# Patient Record
Sex: Male | Born: 1960 | Race: White | Hispanic: No | Marital: Married | Smoking: Current every day smoker
Health system: Southern US, Community
[De-identification: ages and names within clinical notes are randomized; demographics above are authoritative.]

## PROBLEM LIST (undated history)

## (undated) DIAGNOSIS — R7611 Nonspecific reaction to tuberculin skin test without active tuberculosis: Secondary | ICD-10-CM

## (undated) DIAGNOSIS — I10 Essential (primary) hypertension: Secondary | ICD-10-CM

## (undated) DIAGNOSIS — N529 Male erectile dysfunction, unspecified: Secondary | ICD-10-CM

## (undated) DIAGNOSIS — Z8781 Personal history of (healed) traumatic fracture: Secondary | ICD-10-CM

## (undated) DIAGNOSIS — R7989 Other specified abnormal findings of blood chemistry: Secondary | ICD-10-CM

## (undated) DIAGNOSIS — Z789 Other specified health status: Secondary | ICD-10-CM

## (undated) DIAGNOSIS — F17200 Nicotine dependence, unspecified, uncomplicated: Secondary | ICD-10-CM

## (undated) HISTORY — DX: Nicotine dependence, unspecified, uncomplicated: F17.200

## (undated) HISTORY — DX: Male erectile dysfunction, unspecified: N52.9

## (undated) HISTORY — PX: NECK SURGERY: SHX720

## (undated) HISTORY — DX: Other specified abnormal findings of blood chemistry: R79.89

## (undated) HISTORY — DX: Personal history of (healed) traumatic fracture: Z87.81

## (undated) HISTORY — DX: Essential (primary) hypertension: I10

## (undated) HISTORY — DX: Other specified health status: Z78.9

## (undated) HISTORY — PX: APPENDECTOMY: SHX54

## (undated) HISTORY — DX: Nonspecific reaction to tuberculin skin test without active tuberculosis: R76.11

---

## 2006-12-21 ENCOUNTER — Ambulatory Visit: Payer: Self-pay | Admitting: General Surgery

## 2006-12-23 ENCOUNTER — Other Ambulatory Visit: Payer: Self-pay

## 2006-12-23 ENCOUNTER — Ambulatory Visit: Payer: Self-pay | Admitting: General Surgery

## 2007-01-23 ENCOUNTER — Emergency Department: Payer: Self-pay | Admitting: Emergency Medicine

## 2009-05-23 ENCOUNTER — Ambulatory Visit: Payer: Self-pay | Admitting: Neurology

## 2009-05-28 ENCOUNTER — Inpatient Hospital Stay (HOSPITAL_COMMUNITY): Admission: RE | Admit: 2009-05-28 | Discharge: 2009-05-29 | Payer: Self-pay | Admitting: Neurosurgery

## 2010-07-22 IMAGING — CR DG CHEST 2V
2 series · 2 of 2 positions shown · non-contrast
Comparison: None

CLINICAL DATA: Preop cervical HNP

CHEST - 2 VIEW

[view not recorded (1 of 2)]
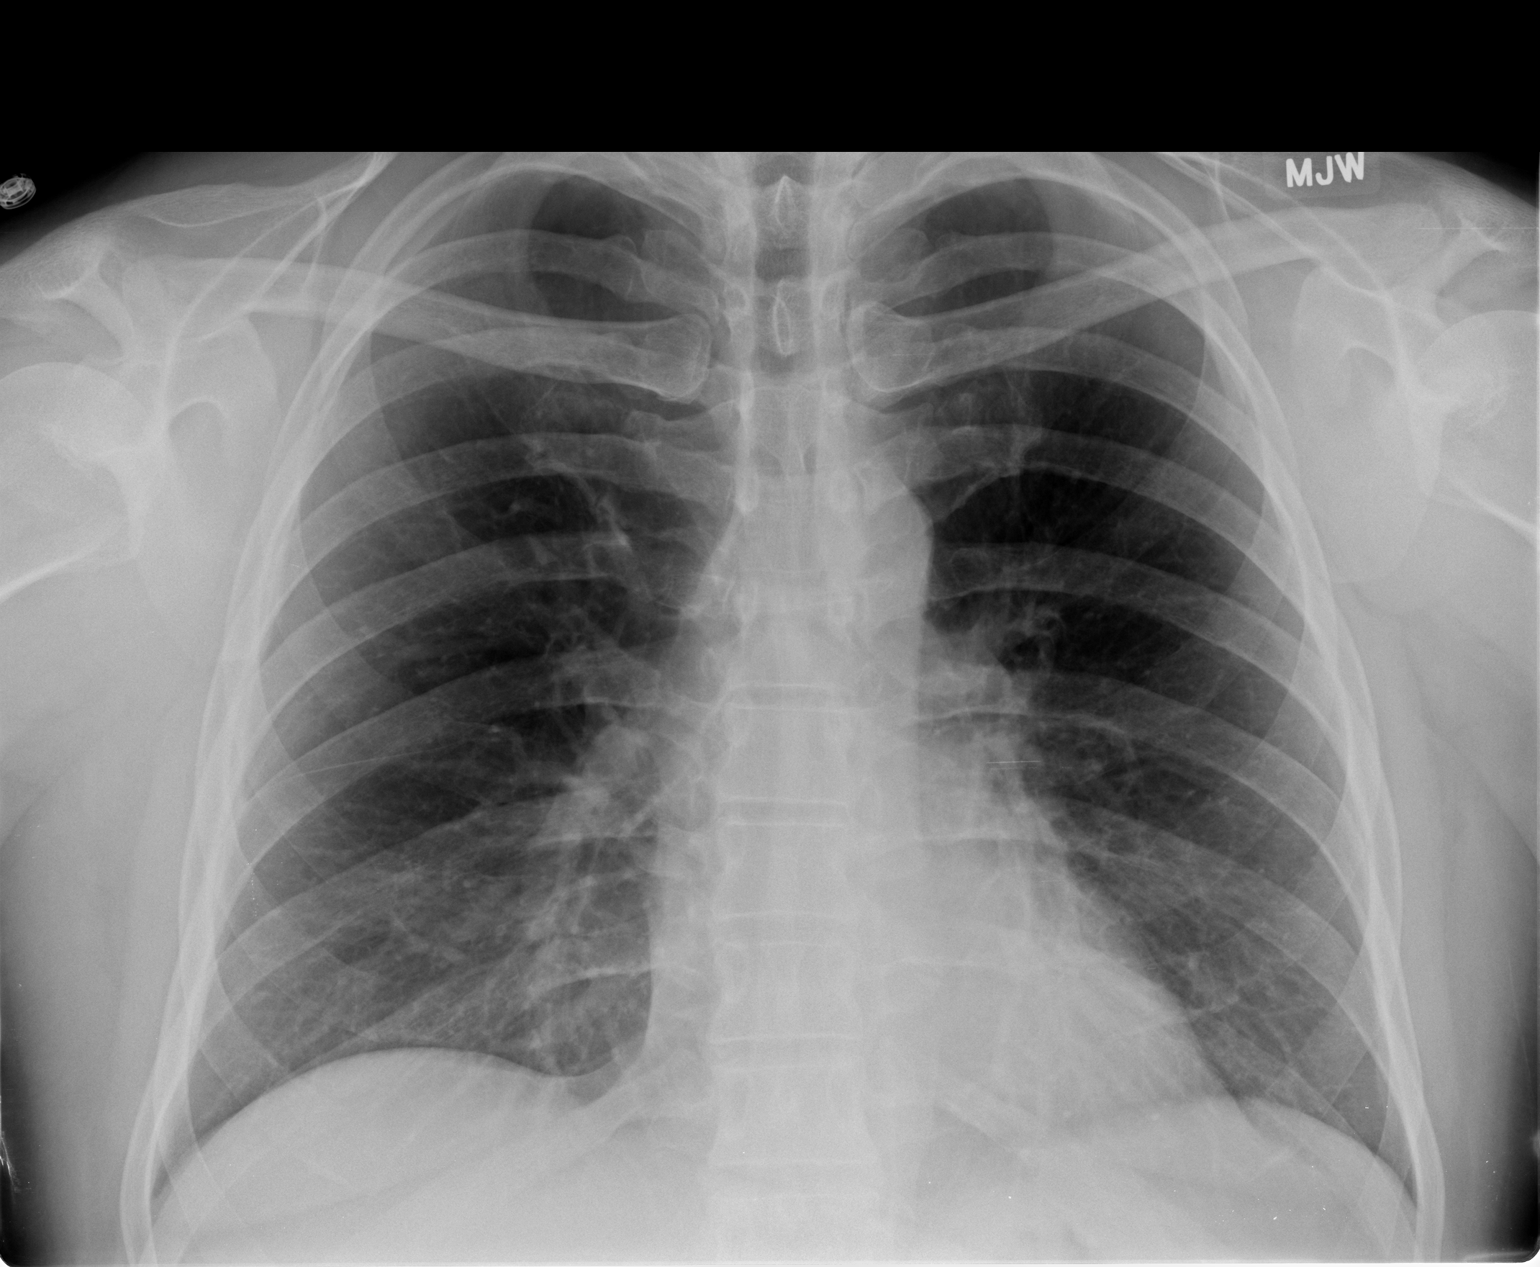

[view not recorded (2 of 2)]
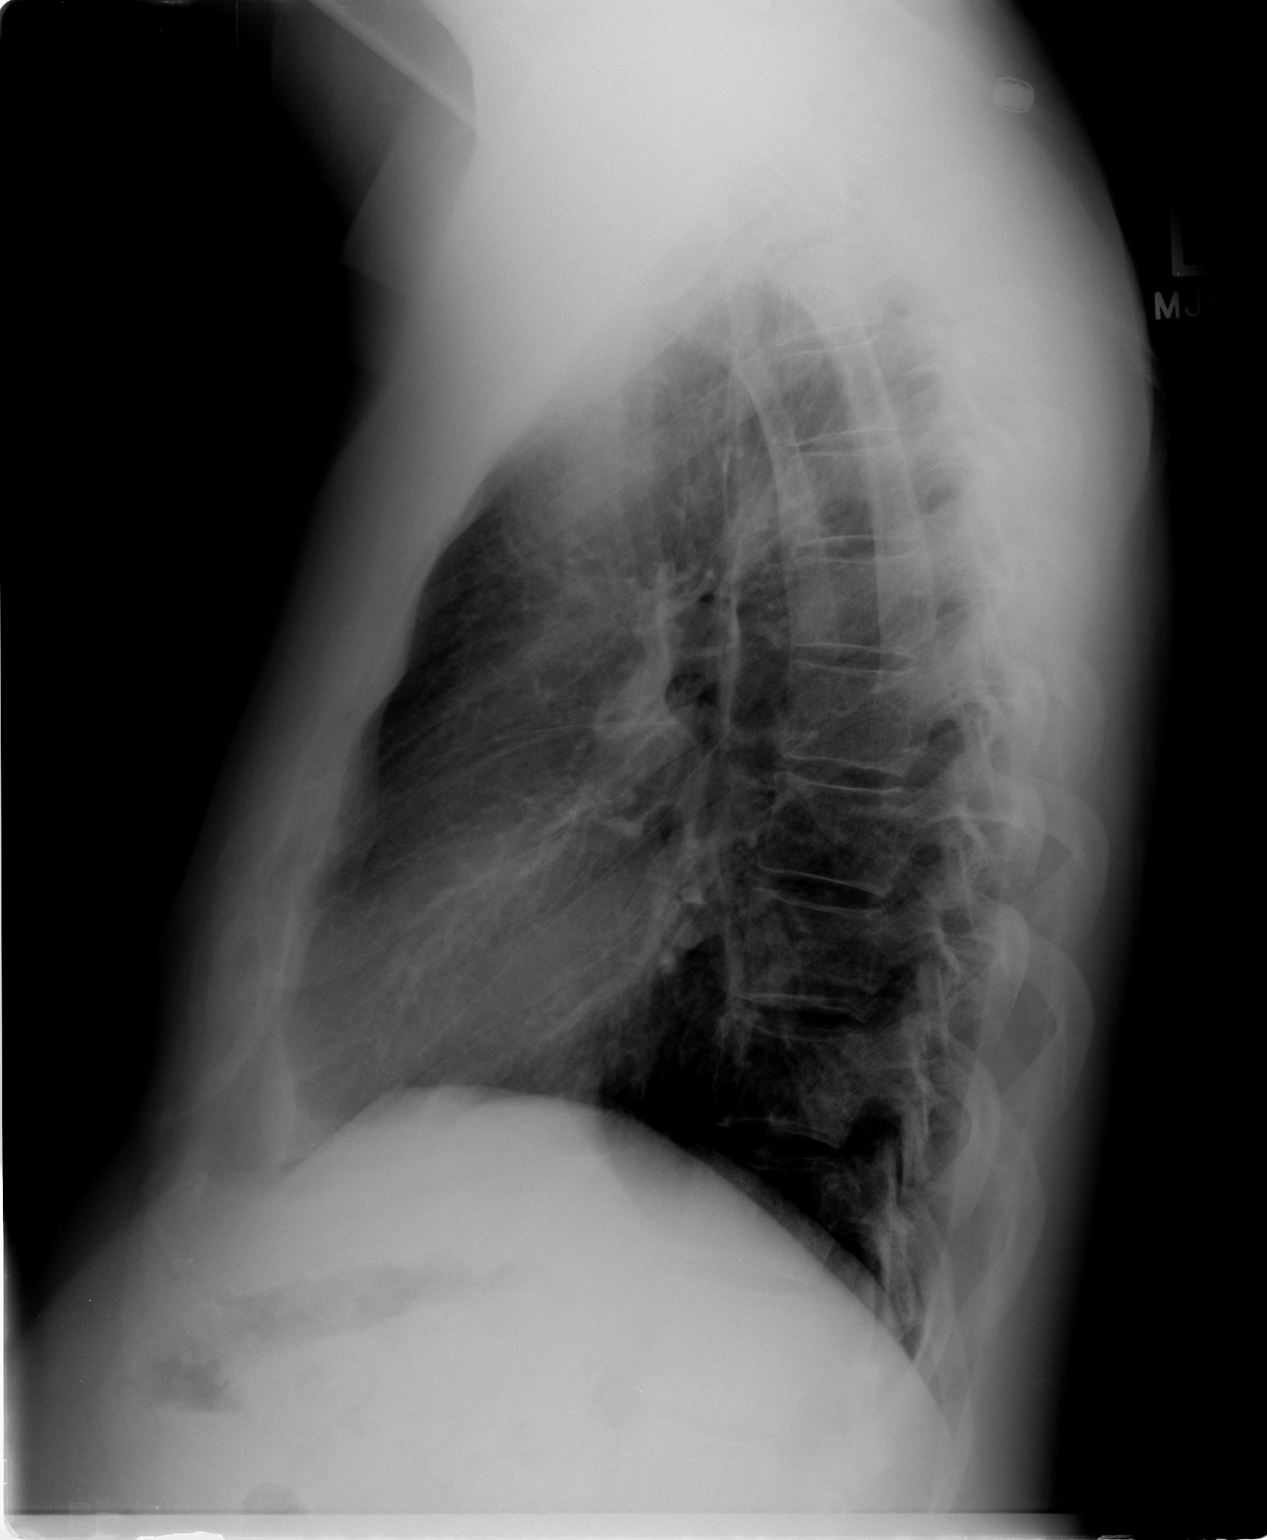

[2 of 2 positions shown; findings below may reference images not displayed]

FINDINGS: Cardiomediastinal silhouette is unremarkable.  No acute
infiltrate or pleural effusion.

No pulmonary edema.  Bony thorax is unremarkable.
IMPRESSION: No active disease.

## 2011-01-25 LAB — APTT: aPTT: 24 seconds (ref 24–37)

## 2011-01-25 LAB — BASIC METABOLIC PANEL
CO2: 26 mEq/L (ref 19–32)
Calcium: 9.2 mg/dL (ref 8.4–10.5)
Creatinine, Ser: 0.87 mg/dL (ref 0.4–1.5)
GFR calc Af Amer: >60 mL/min (ref >60)

## 2011-01-25 LAB — URINALYSIS, ROUTINE W REFLEX MICROSCOPIC
Glucose, UA: NEGATIVE mg/dL
Nitrite: NEGATIVE
Specific Gravity, Urine: 1.025 (ref 1.005–1.030)
pH: 6 (ref 5.0–8.0)

## 2011-01-25 LAB — CBC
MCV: 94.9 fL (ref 78.0–100.0)
RBC: 4.5 MIL/uL (ref 4.22–5.81)

## 2011-03-03 NOTE — Op Note (Signed)
NAME:  Jesse Sweeney, Jesse Sweeney                 ACCOUNT NO.:  [id number]   MEDICAL RECORD NO.:  [id number]          PATIENT TYPE:  INP   LOCATION:  3039                         FACILITY:  [facility]   PHYSICIAN:  Clydene Fake, M.D.  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   PROCEDURES:  Anterior cervical decompression, diskectomy, and fusion at  C4-5, 5-6, 6-7 with LifeNet allograft bone, Trestle anterior cervical  plate, and placement of a JP drain.   SURGEON:  Clydene Fake, MD   ASSISTANT:  Hilda Lias, MD   ANESTHESIA:  General endotracheal tube anesthesia.   ESTIMATED BLOOD LOSS:  Minimal.   BLOOD GIVEN:  None.   COMPLICATIONS:  None.   REASON FOR PROCEDURE:  The patient is a 50 year old gentleman, who has  had pain in the arms with dexterity, problems in the hands, and trouble  with walking.  Neurology workup is done including an MRI of cervical  spine showing cord compression and significant stenosis at C4-5 with  cord changes seen there and some severe spinal changes in the next 2  levels, 5-6 and 6-7 with canal stenosis there also with foraminal  narrowing.  The patient was brought in for 3-level decompression and  fusion.   PROCEDURE IN DETAIL:  The patient was brought in to the operating room  and general anesthesia was induced.  The patient was placed in a 10-  pound Halter traction, prepped and draped in sterile fashion.  Site of  incision injected with 10 mL of 1% lidocaine with epinephrine.  Incision  was then made from the midline to the anterior border of the  sternocleidomastoid muscle  on the left side.  The neck incision was  taken down to the platysma and hemostasis obtained with Bovie  cauterization.  The Bovie was used to incise the platysma, and then  blunt dissection taken through the anterior cervical fascia to the  anterior cervical spine.  Two needles were placed in interspaces.  X-  rays obtained showing that needles were at the 4-5 and 5-6 interspaces.  Disk spaces were incised with 15-blade and partial diskectomy performed  with pituitary rongeurs and the needles were removed.  Longus colli  muscles were reflected laterally using the Bovie from C4 through C7, and  self-retaining retractor was then placed, so we could see the 4-5 and 5-  6 interspaces.  Distraction pins were placed in C4 and C6.  Interspaces  distracted.  Microscope was then brought in for microdissection.  Anterior osteophytes were removed with Leksell rongeurs and Kerrison  punches, and then diskectomy continued.  Starting at 4-5 level using  curettes and 1- and 2-mm Kerrison punches, disk material was removed.  Posterior ligament and disk osteophytes were removed.  We decompressed  the central canal and bilateral foramen.  When we were finished, we had  good central decompression, and the nerve roots laterally were  decompressed.  Hemostasis with Gelfoam and thrombin.  We used a high-  speed drill to remove cartilaginous endplate, measured at disk space to  be 6 mm, and a 6-mm LifeNet allograft bone was tapped into place,  countersunk a couple of millimeters.  At the 5-6 level, disks were  severely spondylotic, and we drilled through the disk space with high-  speed drill and performed  most of the diskectomy.  As we got  posteriorly, 1- and 2-mm Kerrison punches were used to remove the  posterior disk osteophyte ligament, decompressing the central canal, and  then performing bilateral foraminotomies.  When we were finished, we had  good decompression of central canal and the bilateral  foramen.  We  measured height of disk space to be 4 mm, and a 4-mm LifeNet allograft  bone was tapped into place, countersunk a couple of millimeters.  This  was done after getting hemostasis with Gelfoam and thrombin, which was  then irrigated out.  We irrigated with antibiotic solution, and bone  plugs were in good position, and distraction pin was removed from C4.  We moved the retractors down, so we could see the 6-7 level, incised the  disk space at 6-7.  Started diskectomy with pituitary rongeurs, placed  the distraction pin in C7, and then distracted at 6-7 interspace.  Diskectomy then continued with pituitary rongeurs and curettes, and 1-  and 2-mm Kerrison punches were used to remove posterior disk osteophyte  ligament, decompressing the central canal, and then bilateral  foraminotomies were performed.  When we were finished, again we had good  decompression of central canal and bilateral foramen.  High-speed drill  was used to remove the callous endplate, measured the height of disk  space to be 5 mm, and a 5-mm LifeNet allograft bone was tapped into  place, countersunk a couple of millimeters.  At this point, the  distraction pins were removed.  Hemostasis was obtained with Gelfoam and  thrombin.  Weight was removed from the traction and the bones were  firmly in place.  We irrigated with antibiotic solution, and a Trestle  anterior cervical plate was placed over the anterior cervical spine and  2 screws were placed in the C4, 2 in the C5, 2 in the C6, and 2 in the  C7, and these were tightened down.  Lateral x-rays obtained showing good  position of plate, screws, and bone plugs.  At 4-5 and 5-6, we cannot  see below that due to the shoulders, but looked intraoperatively.  Retractors were removed.  Hemostasis was obtained with bipolar  cauterization, Gelfoam, and thrombin.  We irrigated with antibiotic  solution.  A JP drain was placed through a separate stab wound incision   and laid on top of the plate and above the anterior cervical spine.  This was sutured in with a nylon suture.  The platysma was then closed  with 3-0 Vicryl interrupted sutures.  Subcutaneous tissue was closed  with the same.  Skin was closed with benzoin and Steri-Strips.  Dressing  was placed.  The patient was placed in a soft cervical collar, awoken  from anesthesia, and transferred to recovery room in stable condition.           ______________________________  Clydene Fake, M.D.     JRH/MEDQ  D:  05/28/2009  T:  05/29/2009  Job:  [id number]

## 2011-03-14 ENCOUNTER — Ambulatory Visit: Payer: Self-pay | Admitting: Family Medicine

## 2011-11-09 ENCOUNTER — Emergency Department: Payer: Self-pay | Admitting: Emergency Medicine

## 2011-11-11 ENCOUNTER — Ambulatory Visit: Payer: Self-pay | Admitting: General Practice

## 2011-11-13 ENCOUNTER — Ambulatory Visit: Payer: Self-pay | Admitting: General Practice

## 2011-11-13 HISTORY — PX: OTHER SURGICAL HISTORY: SHX169

## 2011-12-30 ENCOUNTER — Encounter: Payer: Self-pay | Admitting: General Practice

## 2012-01-18 ENCOUNTER — Encounter: Payer: Self-pay | Admitting: General Practice

## 2012-06-15 ENCOUNTER — Ambulatory Visit: Payer: Self-pay | Admitting: General Practice

## 2012-06-15 DIAGNOSIS — S76119A Strain of unspecified quadriceps muscle, fascia and tendon, initial encounter: Secondary | ICD-10-CM

## 2012-06-15 HISTORY — PX: OTHER SURGICAL HISTORY: SHX169

## 2012-06-15 HISTORY — DX: Strain of unspecified quadriceps muscle, fascia and tendon, initial encounter: S76.119A

## 2012-06-15 LAB — POTASSIUM: Potassium: 4.1 mmol/L (ref 3.5–5.1)

## 2015-02-05 NOTE — Op Note (Signed)
PATIENT NAME:  Jesse Sweeney, Jesse Sweeney MR#:  [id number] DATE OF BIRTH:  [birth year]  DATE OF PROCEDURE:  06/15/2012  PREOPERATIVE DIAGNOSIS: Rerupture of left quadriceps tendon.   POSTOPERATIVE DIAGNOSIS: Rerupture of left quadriceps tendon.   PROCEDURE PERFORMED: Repair of left quadriceps tendon rerupture.   SURGEON: Illene LabradorJames P. Angie FavaHooten, Jr., MD    ANESTHESIA: Spinal and general.   ESTIMATED BLOOD LOSS: 50 mL.   FLUIDS REPLACED: 2000 mL of crystalloid.   TOURNIQUET TIME: 90 minutes.   INDICATIONS FOR SURGERY: The patient is a 54 year old male who sustained rupture of the left quadriceps tendon several months ago. He was treated operatively with repair of the rupture. Postoperatively, his compliance was less than optimal. He presented recently with a palpable defect in the extensor tendon with an extensor lag appreciated. Findings were consistent with rerupture of the left quadriceps tendon. After discussion of the risks and benefits of surgical intervention, the patient expressed his understanding of the risks and benefits and agreed with plans for surgical intervention.   PROCEDURE IN DETAIL: The patient was brought into the Operating Room, and after adequate spinal and subsequently general anesthesia was achieved, a tourniquet was placed on the patient's upper left thigh. The left knee and leg were cleaned and prepped with alcohol and DuraPrep and draped in the usual sterile fashion. A "time out" was performed as per usual protocol. The left lower extremity was exsanguinated using an Esmarch, and the tourniquet was inflated to 300 mmHg. An anterior longitudinal incision was made in line with the previous surgical scar. Dissection was carried down to the quadriceps tendon. There was a significant defect noted centrally. The retinaculum was intact. Significant attenuation of the distal aspect of the tendon was appreciated. The area was debrided of scar tissue, and the leading edge of the quadriceps tendon was  freshened. A significant amount of time was dedicated to removal of the #5 Ethibond sutures. A bone groove was re-created along the superior pole of the patella for reattachment of the quadriceps tendon. There was enough mobility of the tendon to allow for end-to-end approximation. Two bundles were created using #5 Ethibond. Drill holes were placed in the patella, and the #5 Ethibond sutures were passed through the drill holes. The quadriceps tendon was reduced and maintained in position using reduction forceps. The #5 Ethibond sutures were tied distal to the inferior pole of the patella. Good approximation of the tendon in the  bone groove was noted. The area of the tear was then oversewn using interrupted sutures of #1 Ethibond. The tourniquet was deflated after a total tourniquet time of 90 minutes. Hemostasis was achieved using electrocautery. Gentle flexion was applied with good maintenance of the repair. The wound was then closed in layers using first #0 Vicryl followed by 2-0 Vicryl. The skin was closed with skin staples. Sterile dressing was applied followed by application of a Polar Care and range of motion brace locked in full extension.   The patient tolerated the procedure well. He was transported to the recovery room in stable condition.   ____________________________ Illene LabradorJames P. Angie FavaHooten Jr., MD jph:cbb D: 06/15/2012 23:17:38 ET T: 06/16/2012 10:00:23 ET JOB#: [id number]  cc: Illene LabradorJames P. Angie FavaHooten Jr., MD, <Dictator> JAMES P Angie FavaHOOTEN JR MD ELECTRONICALLY SIGNED 06/20/2012 7:59

## 2015-02-10 NOTE — Op Note (Signed)
PATIENT NAME:  Jesse Sweeney, Jesse Sweeney MR#:  [id number] DATE OF BIRTH:  May 19, 1961  DATE OF PROCEDURE:  11/13/2011  PREOPERATIVE DIAGNOSIS: Left quadriceps tendon rupture/tear.   POSTOPERATIVE DIAGNOSIS: Left quadriceps tendon rupture/tear.   PROCEDURE PERFORMED: Repair of left quadriceps tendon rupture and repair of medial and lateral retinaculum.   SURGEON: Illene LabradorJames P. Angie FavaHooten, Jr., MD    ANESTHESIA: General.   ESTIMATED BLOOD LOSS: 25 mL.   FLUIDS REPLACED: 700 mL of crystalloid.   TOURNIQUET TIME: 90 minutes.   DRAINS: None.   INDICATIONS FOR SURGERY: The patient is a 54 year old male who sustained an injury while on a set of stairs. He was unable to actively extend the knee, and a palpable defect was noted in the area of the quadriceps tendon. MRI demonstrated findings consistent with rupture of the left quadriceps tendon at its insertion to the patella. After a discussion of the risks and benefits of surgical intervention, the patient expressed his understanding of the risks and benefits and agreed with plans for surgical intervention.   PROCEDURE IN DETAIL: The patient was brought into the Operating Room, and after adequate general anesthesia was achieved the tourniquet was placed on the patient's left thigh. The patient's left knee and leg were cleaned and prepped with alcohol and DuraPrep and draped in the usual sterile fashion. The patient's left knee and leg were cleaned and prepped with alcohol and DuraPrep and draped in the usual sterile fashion. A "timeout" was performed as per usual protocol. The left lower extremity was exsanguinated using an Esmarch, and the tourniquet was inflated to 300 mmHg. An anterior longitudinal incision was made. A large hemarthrosis was evacuated. There was full thickness rupture of the quadriceps tendon at its insertion to the superior pole of the patella. Significant tears of the medial and lateral retinaculum extended from the site. Rongeurs were used to create  a small trough along the superior pole of the patella for reinsertion of the quadriceps tendon. The leading edge of the tendon was debrided sharply. Two sutures of #5 Ethibond were then utilized for creating bundles utilizing a [place] technique. Three drill holes were created from superior to inferior in the patella approximately 1.5 cm apart, and the #5 Ethibond sutures were brought through the drill holes using suture passers. Tension was applied with good approximation at the site of the tendon rupture. First the lateral bundle was secured, followed by securing the medial bundle. The knee was placed through a gentle flexion with good maintenance of the reduction. The wound was irrigated with copious amounts of normal saline with antibiotic solution. The lateral retinacular tear was then repaired using a running suture of #1 Ethibond. A medial retinacular tear was also repaired using a running suture of #1 Ethibond. The wound was then irrigated again with copious amounts of normal saline with antibiotic solution. The wound was closed in layers using first #0 Vicryl, followed by 2-0 Vicryl. The skin was closed with skin staples, and 0.25% Marcaine with epinephrine was injected along the incision site. The tourniquet was deflated after a total tourniquet time of 90 minutes. A sterile dressing was applied, followed by application of a range of motion brace locked in full extension.   The patient tolerated the procedure well. He was transported to the recovery room in stable condition.   ____________________________ Illene LabradorJames P. Angie FavaHooten Jr., MD jph:cbb D: 11/13/2011 17:23:20 ET T: 11/14/2011 08:02:42 ET JOB#: [id number]  cc: Fayrene FearingJames P. Angie FavaHooten Jr., MD, <Dictator> JAMES P Angie FavaHOOTEN JR MD ELECTRONICALLY SIGNED  11/22/2011 16:02 

## 2017-06-08 ENCOUNTER — Other Ambulatory Visit: Payer: Self-pay | Admitting: Neurology

## 2017-06-08 DIAGNOSIS — M5410 Radiculopathy, site unspecified: Secondary | ICD-10-CM

## 2017-06-14 ENCOUNTER — Ambulatory Visit
Admission: RE | Admit: 2017-06-14 | Discharge: 2017-06-14 | Disposition: A | Discharge status: Home / Self Care | Payer: 59 | Source: Ambulatory Visit | Attending: Neurology | Admitting: Neurology

## 2017-06-14 DIAGNOSIS — M4802 Spinal stenosis, cervical region: Secondary | ICD-10-CM | POA: Diagnosis not present

## 2017-06-14 DIAGNOSIS — M4722 Other spondylosis with radiculopathy, cervical region: Secondary | ICD-10-CM | POA: Diagnosis not present

## 2017-06-14 DIAGNOSIS — Z981 Arthrodesis status: Secondary | ICD-10-CM | POA: Insufficient documentation

## 2017-06-14 DIAGNOSIS — M5410 Radiculopathy, site unspecified: Secondary | ICD-10-CM | POA: Diagnosis present

## 2018-08-08 IMAGING — MR MR CERVICAL SPINE W/O CM
5 series · 33 of 48 positions shown · non-contrast
Comparison: None.

CLINICAL DATA: This prior cervical fusion in 8447. Left shoulder
and arm pain.

EXAM:
MRI CERVICAL SPINE WITHOUT CONTRAST
TECHNIQUE: Multiplanar, multisequence MR imaging of the cervical spine was
performed. No intravenous contrast was administered.

[Series 3: T2 · sagittal · 3.0mm · 0.70mm/px · 6 of 15 slices shown (1 of 2)]
[im 1/15]
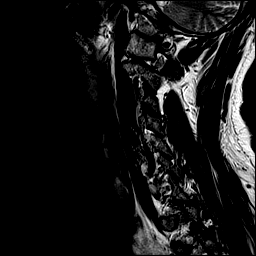
[im 3/15]
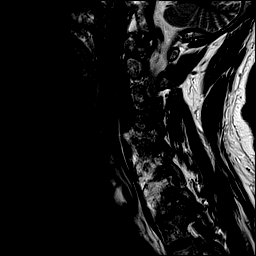
[im 6/15]
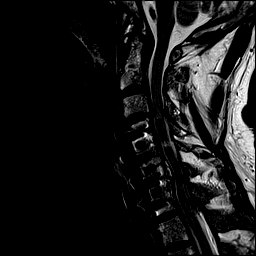
[im 9/15]
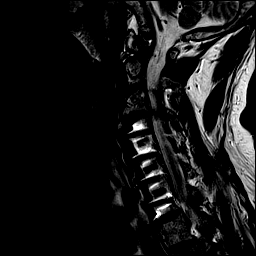
[im 12/15]
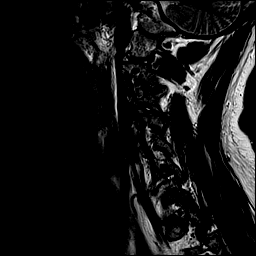
[im 15/15]
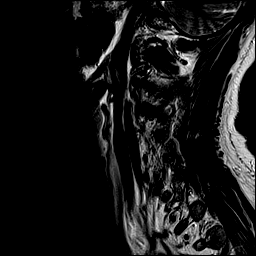

[Series 4: T1 · sagittal · 3.0mm · 0.70mm/px · 7 of 15 slices shown]
[im 1/15]
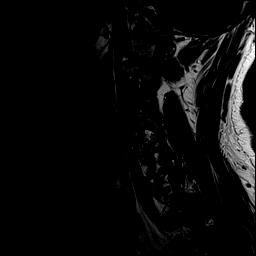
[im 3/15]
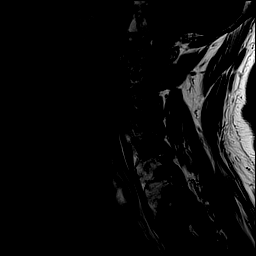
[im 5/15]
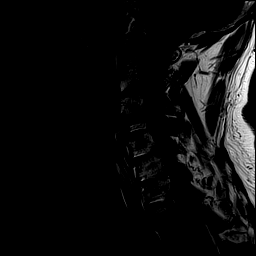
[im 8/15]
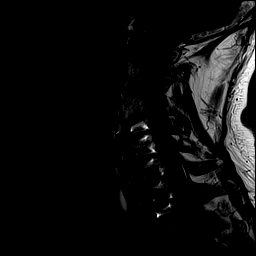
[im 10/15]
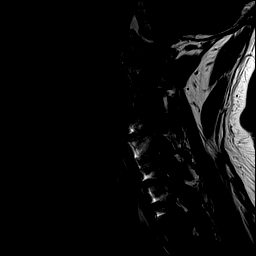
[im 12/15]
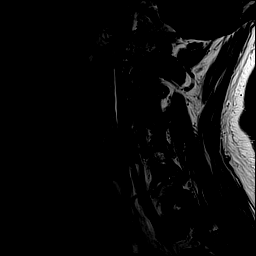
[im 15/15]
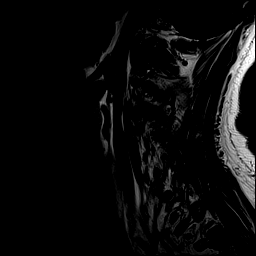

[Series 5: STIR · sagittal · 3.0mm · 0.35mm/px · 7 of 15 slices shown]
[im 1/15]
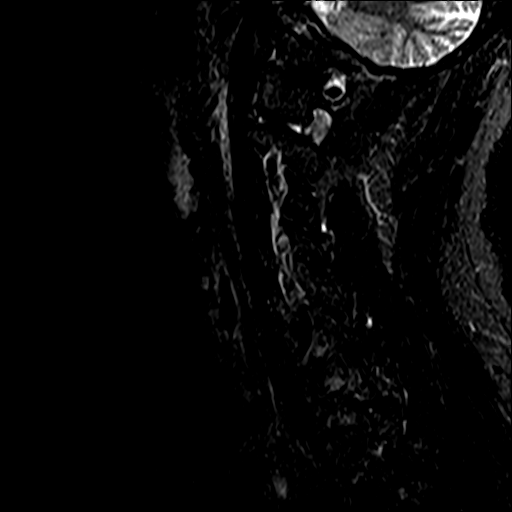
[im 3/15]
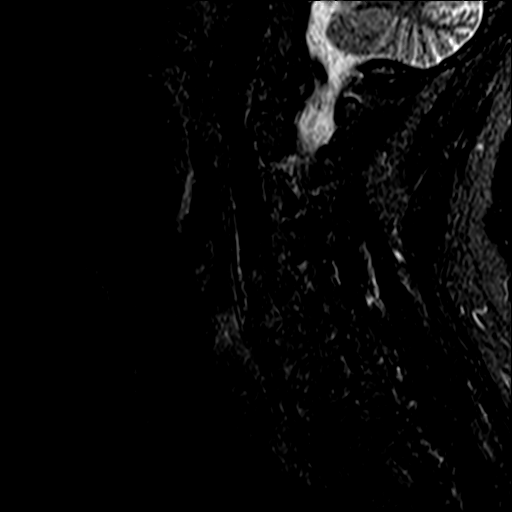
[im 5/15]
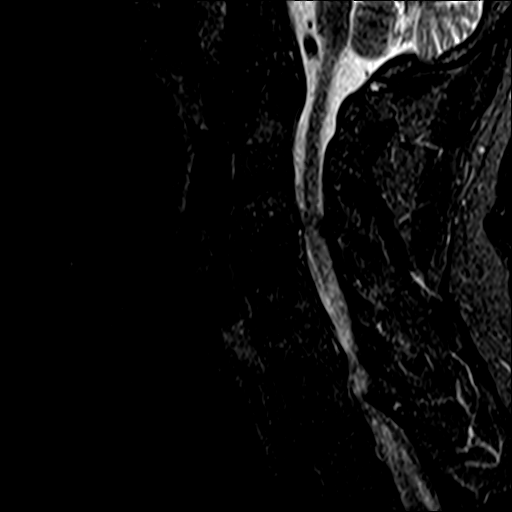
[im 8/15]
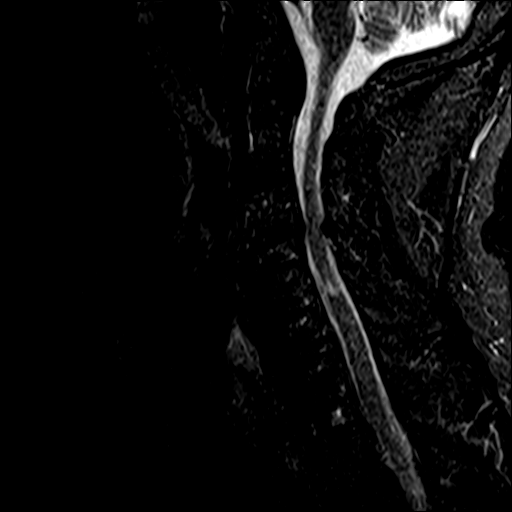
[im 10/15]
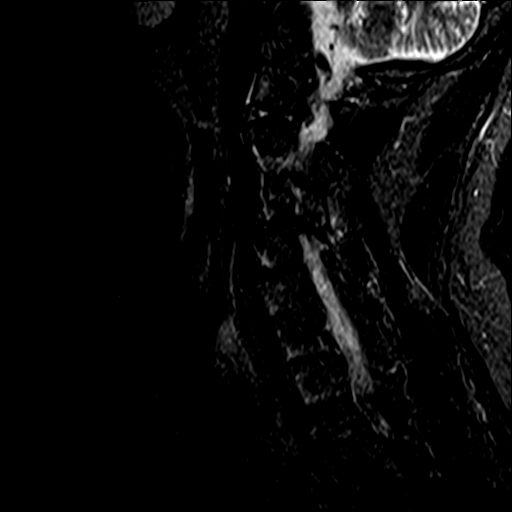
[im 12/15]
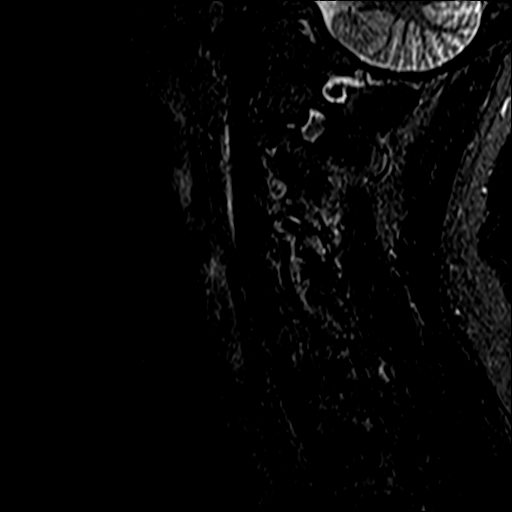
[im 15/15]
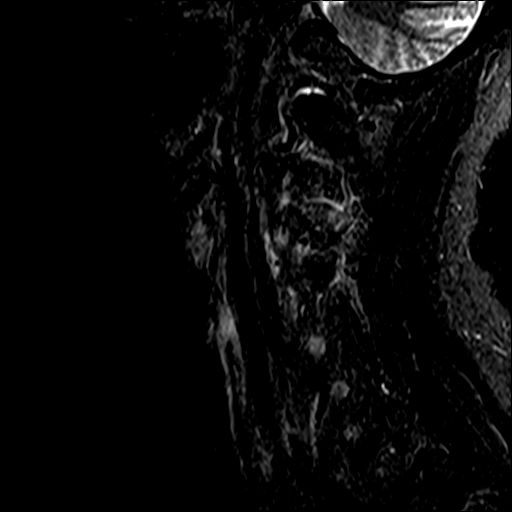

[Series 6: T2 · axial · 3.0mm · 0.70mm/px · z∈[-88,+18]mm · 8 of 29 slices shown (2 of 2)]
[im 1/29]
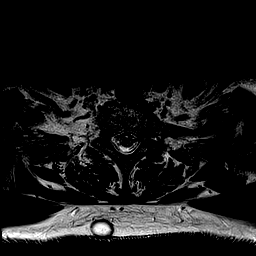
[im 5/29]
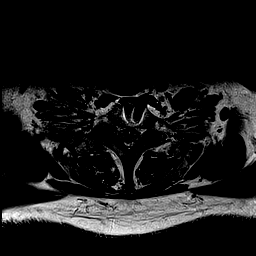
[im 9/29]
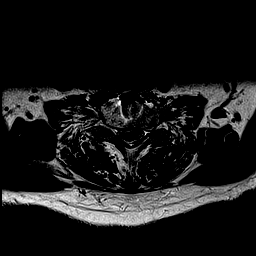
[im 13/29]
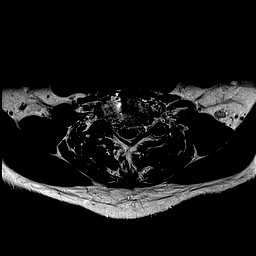
[im 16/29]
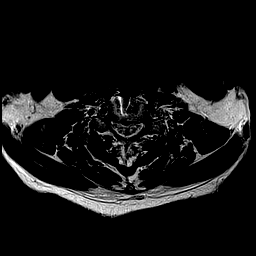
[im 20/29]
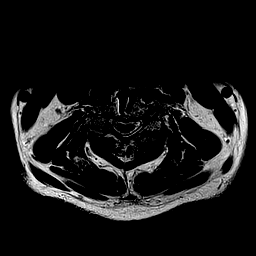
[im 24/29]
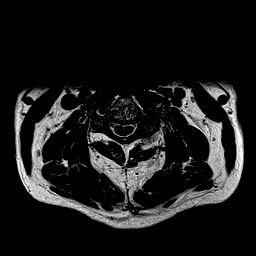
[im 29/29]
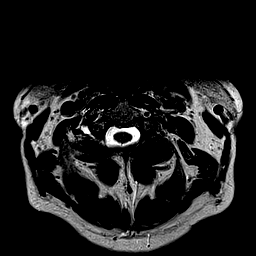

[Series 7: mpgr ax · axial · 3.0mm · 0.35mm/px · z∈[-88,-31]mm · 5 of 29 slices shown]
[im 1/29]
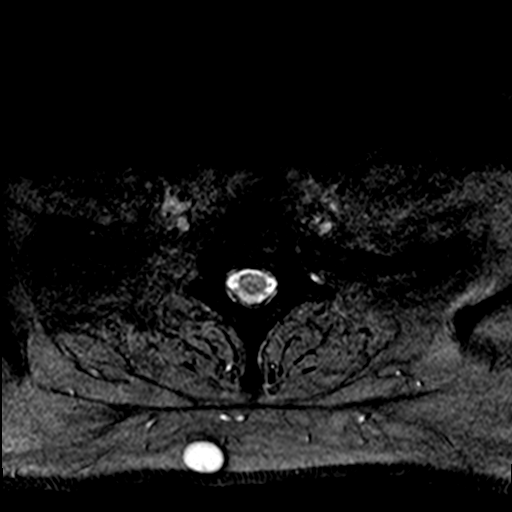
[im 5/29]
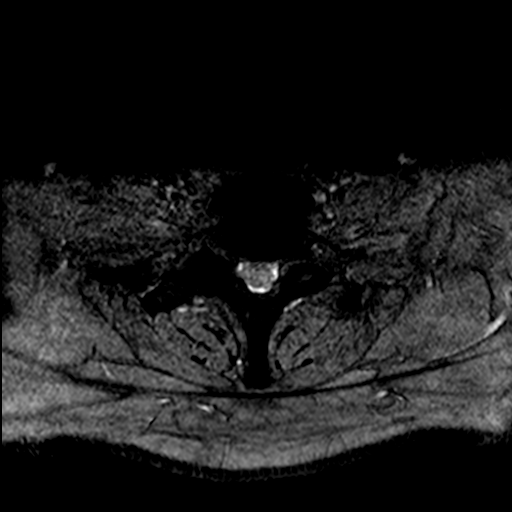
[im 9/29]
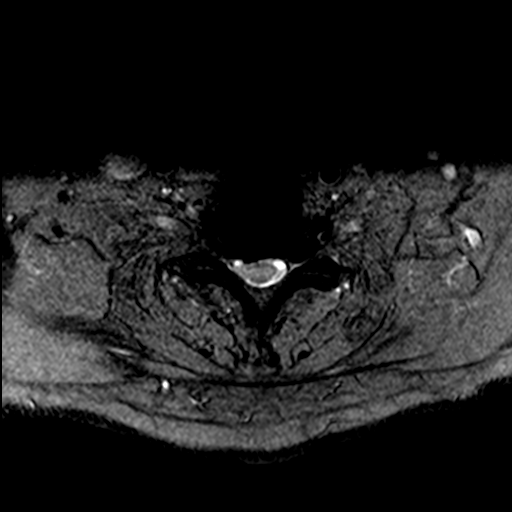
[im 13/29]
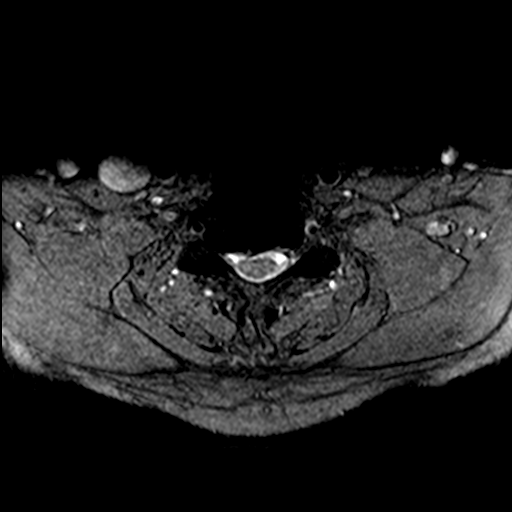
[im 16/29]
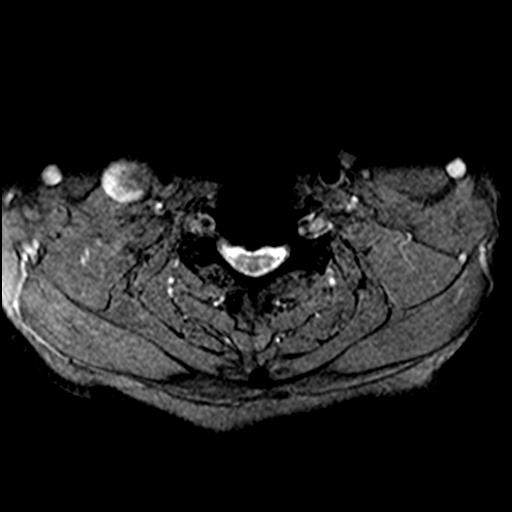

[33 of 48 positions shown; findings below may reference images not displayed]

FINDINGS: Alignment: 2 mm retrolisthesis of C3 on C4.

Vertebrae: No fracture, evidence of discitis, or bone lesion.

Cord: Mild thinning of the cervical spinal cord at C4 with mild T2
hyperintensity likely reflecting mild myelomalacia.

Posterior Fossa, vertebral arteries, paraspinal tissues: Posterior
fossa demonstrates no focal abnormality. Vertebral artery flow voids
are maintained. Paraspinal soft tissues are unremarkable.

Disc levels:

Discs: Anterior cervical fusion from C4 through C7. Mild degenerate
disc disease with disc height loss at C3-4.

C2-3: No significant disc bulge. Mild bilateral facet arthropathy.
Mild bilateral foraminal narrowing. No central canal stenosis.

C3-4: Small central disc protrusion. Left uncovertebral degenerative
changes and left facet arthropathy with severe left foraminal
stenosis. Mild right foraminal stenosis. No central canal stenosis.

C4-5: Interbody fusion. No neural foraminal stenosis. No central
canal stenosis.

C5-6: Interbody fusion. Prominent osseous ridging along the right
paracentral and foraminal aspect deforming the right paracentral
cervical spinal cord with severe foraminal stenosis. No left
foraminal stenosis. No central canal stenosis.

C6-7: Interbody fusion. Mild bilateral foraminal narrowing. No
central canal stenosis.

C7-T1: No significant disc bulge. No neural foraminal stenosis. No
central canal stenosis.
IMPRESSION: 1. Anterior cervical fusion from C4 through C7. At C5-6 there is
interbody fusion with prominent osseous ridging along the right
paracentral and foraminal aspect deforming the right paracentral
cervical spinal cord with severe foraminal stenosis.
2. At C3-4 there is a small central disc protrusion. Left
uncovertebral degenerative changes and left facet arthropathy with
severe left foraminal stenosis. Mild right foraminal stenosis.
3. Mild thinning of the cervical spinal cord at C4 with mild T2
hyperintensity likely reflecting mild myelomalacia.

## 2019-12-29 ENCOUNTER — Ambulatory Visit: Payer: Self-pay | Attending: Internal Medicine

## 2019-12-29 DIAGNOSIS — Z23 Encounter for immunization: Secondary | ICD-10-CM

## 2019-12-29 NOTE — Progress Notes (Signed)
   Covid-19 Vaccination Clinic  Name:  Jesse Sweeney    MRN: [id number] DOB: 1960-12-26  12/29/2019  [name] was observed post Covid-19 immunization for 15 minutes without incident. He was provided with Vaccine Information Sheet and instruction to access the V-Safe system.   [name] was instructed to call 911 with any severe reactions post vaccine: Marland Kitchen Difficulty breathing  . Swelling of face and throat  . A fast heartbeat  . A bad rash all over body  . Dizziness and weakness   Immunizations Administered    Name Date Dose VIS Date Route   Moderna COVID-19 Vaccine 12/29/2019 10:57 AM 0.5 mL 09/19/2019 Intramuscular   Manufacturer: Moderna   Lot: [id number]   NDC: 45038-882-80

## 2020-01-30 ENCOUNTER — Ambulatory Visit: Payer: Self-pay | Attending: Internal Medicine

## 2020-01-30 DIAGNOSIS — Z23 Encounter for immunization: Secondary | ICD-10-CM

## 2020-01-30 NOTE — Progress Notes (Signed)
   Covid-19 Vaccination Clinic  Name:  EDDISON SEARLS    MRN: [id number] DOB: Mar 11, 1961  01/30/2020  Mr. Hudler was observed post Covid-19 immunization for 15 minutes without incident. He was provided with Vaccine Information Sheet and instruction to access the V-Safe system.   Mr. Cragle was instructed to call 911 with any severe reactions post vaccine: Marland Kitchen Difficulty breathing  . Swelling of face and throat  . A fast heartbeat  . A bad rash all over body  . Dizziness and weakness   Immunizations Administered    Name Date Dose VIS Date Route   Moderna COVID-19 Vaccine 01/30/2020 10:51 AM 0.5 mL 09/19/2019 Intramuscular   Manufacturer: Moderna   Lot: [id number]   NDC: 71595-396-72

## 2022-11-27 ENCOUNTER — Ambulatory Visit: Payer: Self-pay | Admitting: Orthopedic Surgery

## 2022-11-27 NOTE — Progress Notes (Unsigned)
Referring Physician:  Tracie Harrier, MD [address] [facility] [place],  [place] [address]  Primary Physician:  Tracie Harrier, MD  History of Present Illness: 11/27/2022*** [name] has a history of HTN, DM and elevated LFTs.   History of ACDF C4-C7 by Dr. Luiz Ochoa in [place].   He saw Dr. Aris Lot on 06/22/17. EMG ordered and he was advised to stop smoking. Appears he was lost to follow up.       Duration: *** Location: *** Quality: *** Severity: ***  Precipitating: aggravated by *** Modifying factors: made better by *** Weakness: none Timing: *** Bowel/Bladder Dysfunction: none  Conservative measures:  Physical therapy: ***  Multimodal medical therapy including regular antiinflammatories: ***  Injections: *** epidural steroid injections  Past Surgery: ***  Jesse Sweeney has ***no symptoms of cervical myelopathy.  The symptoms are causing a significant impact on the patient's life.   Review of Systems:  A 10 point review of systems is negative, except for the pertinent positives and negatives detailed in the HPI.  Past Medical History: No past medical history on file.  Past Surgical History: *** The histories are not reviewed yet. Please review them in the "History" navigator section and refresh this Vevay.  Allergies: Allergies as of 11/30/2022   (Not on File)    Medications: No outpatient encounter medications on file as of 11/30/2022.   No facility-administered encounter medications on file as of 11/30/2022.    Social History:    Family Medical History: No family history on file.  Physical Examination: There were no vitals filed for this visit.  General: Patient is well developed, well nourished, calm, collected, and in no apparent distress. Attention to examination is appropriate.  Respiratory: Patient is breathing without any difficulty.   NEUROLOGICAL:     Awake, alert, oriented to person, place, and  time.  Speech is clear and fluent. Fund of knowledge is appropriate.   Cranial Nerves: Pupils equal round and reactive to light.  Facial tone is symmetric.    *** ROM of cervical spine *** pain *** posterior cervical tenderness. *** tenderness in bilateral trapezial region.   *** ROM of lumbar spine *** pain *** posterior lumbar tenderness.   No abnormal lesions on exposed skin.   Strength: Side Biceps Triceps Deltoid Interossei Grip Wrist Ext. Wrist Flex.  R 5 5 5 5 5 5 5  $ L 5 5 5 5 5 5 5   $ Side Iliopsoas Quads Hamstring PF DF EHL  R 5 5 5 5 5 5  $ L 5 5 5 5 5 5   $ Reflexes are ***2+ and symmetric at the biceps, triceps, brachioradialis, patella and achilles.   Hoffman's is absent.  Clonus is not present.   Bilateral upper and lower extremity sensation is intact to light touch.     Gait is normal.   ***No difficulty with tandem gait.    Medical Decision Making  Imaging: MRI cervical spine dated 06/14/17:  FINDINGS: Alignment: 2 mm retrolisthesis of C3 on C4.   Vertebrae: No fracture, evidence of discitis, or bone lesion.   Cord: Mild thinning of the cervical spinal cord at C4 with mild T2 hyperintensity likely reflecting mild myelomalacia.   Posterior Fossa, vertebral arteries, paraspinal tissues: Posterior fossa demonstrates no focal abnormality. Vertebral artery flow voids are maintained. Paraspinal soft tissues are unremarkable.   Disc levels:   Discs: Anterior cervical fusion from C4 through C7. Mild degenerate disc disease with disc height loss at C3-4.  C2-3: No significant disc bulge. Mild bilateral facet arthropathy. Mild bilateral foraminal narrowing. No central canal stenosis.   C3-4: Small central disc protrusion. Left uncovertebral degenerative changes and left facet arthropathy with severe left foraminal stenosis. Mild right foraminal stenosis. No central canal stenosis.   C4-5: Interbody fusion. No neural foraminal stenosis. No central canal  stenosis.   C5-6: Interbody fusion. Prominent osseous ridging along the right paracentral and foraminal aspect deforming the right paracentral cervical spinal cord with severe foraminal stenosis. No left foraminal stenosis. No central canal stenosis.   C6-7: Interbody fusion. Mild bilateral foraminal narrowing. No central canal stenosis.   C7-T1: No significant disc bulge. No neural foraminal stenosis. No central canal stenosis.   IMPRESSION: 1. Anterior cervical fusion from C4 through C7. At C5-6 there is interbody fusion with prominent osseous ridging along the right paracentral and foraminal aspect deforming the right paracentral cervical spinal cord with severe foraminal stenosis. 2. At C3-4 there is a small central disc protrusion. Left uncovertebral degenerative changes and left facet arthropathy with severe left foraminal stenosis. Mild right foraminal stenosis. 3. Mild thinning of the cervical spinal cord at C4 with mild T2 hyperintensity likely reflecting mild myelomalacia.     Electronically Signed   By: Kathreen Devoid   On: 06/14/2017 08:48   I have personally reviewed the images and agree with the above interpretation.  Assessment and Plan: Jesse Sweeney is a pleasant 62 y.o. male has ***  Treatment options discussed with patient and following plan made:   - Order for physical therapy for *** spine ***. Patient to call to schedule appointment. *** - Continue current medications including ***. Reviewed dosing and side effects.  - Prescription for ***. Reviewed dosing and side effects. Take with food.  - Prescription for *** to take prn muscle spasms. Reviewed dosing and side effects. Discussed this can cause drowsiness.  - MRI of *** to further evaluate *** radiculopathy. No improvement time or medications (***).  - Referral to PMR at [facility] to discuss possible *** injections.  - Will schedule phone visit to review MRI results once I get them back.   I spent a total of  *** minutes in face-to-face and non-face-to-face activities related to this patient's care today including review of outside records, review of imaging, review of symptoms, physical exam, discussion of differential diagnosis, discussion of treatment options, and documentation.   Thank you for involving me in the care of this patient.   Geronimo Boot PA-C Dept. of Neurosurgery

## 2022-11-30 ENCOUNTER — Ambulatory Visit: Payer: Managed Care, Other (non HMO) | Admitting: Orthopedic Surgery

## 2022-11-30 ENCOUNTER — Encounter: Payer: Self-pay | Admitting: Orthopedic Surgery

## 2022-11-30 VITALS — BP 138/60 | Ht 73.0 in | Wt 237.6 lb

## 2022-11-30 DIAGNOSIS — M5416 Radiculopathy, lumbar region: Secondary | ICD-10-CM

## 2022-11-30 NOTE — Patient Instructions (Signed)
It was so nice to see you today. Thank you so much for coming in.    The pain in the right leg may be coming from your back.   I ordered xrays of your lower back. You can go across the street to get these at [facility] ([facility] You do not need any appointment. I will message you with results.   I gave you some back exercises to do. Don't start these until I review your xrays and let you know.   You can continue to take over the counter motrin as needed to help with pain and inflammation. Take as directed with food.   Phone visit is set up with me in 6 weeks. Please do not hesitate to call if you have any questions or concerns. You can also message me in [place].   Geronimo Boot PA-C [phone]

## 2023-01-28 ENCOUNTER — Telehealth: Payer: Self-pay | Admitting: Orthopedic Surgery

## 2023-02-02 ENCOUNTER — Ambulatory Visit
Admission: RE | Admit: 2023-02-02 | Discharge: 2023-02-02 | Disposition: A | Discharge status: Home / Self Care | Payer: Managed Care, Other (non HMO) | Source: Ambulatory Visit | Attending: Orthopedic Surgery | Admitting: Orthopedic Surgery

## 2023-02-02 DIAGNOSIS — M5416 Radiculopathy, lumbar region: Secondary | ICD-10-CM | POA: Diagnosis present

## 2023-02-04 NOTE — Progress Notes (Signed)
   Telephone Visit- Progress Note: Referring Physician:  No referring provider defined for this encounter.  Primary Physician:  Barbette Reichmann, MD  This visit was performed via telephone.  Patient location: home Provider location: office  I spent a total of 10 minutes non-face-to-face activities for this visit on the date of this encounter including review of current clinical condition and response to treatment.    Patient has given verbal consent to this telephone visits and we reviewed the limitations of a telephone visit. Patient wishes to proceed.    Chief Complaint:  follow up and review xrays  History of Present Illness: DILON LANK is a 62 y.o. male has a history of HTN and DM.    History of ACDF C4-C7 by Dr. Phoebe Perch in [place].   Last seen by me on 11/30/22 for right posterior leg pain to his knee. No LBP.    Previous lumbar xrays in 2008 showed left transverse process fractures at L3, L4, and L5. History of fall from 10 feet years ago.   Updated lumbar xrays ordered and he recently had them done. He declined PT.   Phone visit to discuss xrays.  He is about the same. He continues with intermittent right posterior leg pain to his knee that is worse at rest. This started about 6 months ago. No LBP. He does okay with standing and walking. He has intermittent numbness in right leg. Some relief with prn motrin. No weakness in right leg.    He smokes 1 ppd.    Conservative measures:  Physical therapy: no  Multimodal medical therapy including regular antiinflammatories: motrin  Injections: no epidural steroid injections   Past Surgery: History of ACDF C4-C7 by Dr. Phoebe Perch in [place]  Exam: No exam done as this was a telephone encounter.     Imaging: Lumbar xrays dated 02/02/23:  FINDINGS: There is no evidence of acute lumbar spine fracture. Scoliosis of spine. Facet joint sclerosis identified in the mid to lower lumbar spine. Mild anterior spurring  identified throughout lumbar spine.   IMPRESSION: Degenerative joint changes of lumbar spine.     Electronically Signed   By: Sherian Rein M.D.   On: 02/03/2023 08:11   I have personally reviewed the images and agree with the above interpretation.  Assessment and Plan: Mr. Gentile is a pleasant 62 y.o. male with intermittent right posterior leg pain to his knee that is worse at rest x 6 months. No LBP. He has intermittent numbness in right leg.   History of left transverse process fractures at L3, L4, and L5 s/p fall from 10 feet years ago.   Xrays show lumbar spondylosis.   Treatment options discussed with patient and following plan made:    - MRI of lumbar spine to further evaluate lumbar radiculopathy.  - Continue prn OTC motrin as directed on bottle. Take with food.  - Okay to start lumbar HEP he was given at last visit.  - Depending on results of MRI, may consider formal PT and/or injections.  - Will schedule follow up with me in clinic to review MRI results once I get them back.   Drake Leach PA-C Neurosurgery

## 2023-02-05 ENCOUNTER — Encounter: Payer: Self-pay | Admitting: Orthopedic Surgery

## 2023-02-05 ENCOUNTER — Ambulatory Visit (INDEPENDENT_AMBULATORY_CARE_PROVIDER_SITE_OTHER): Payer: Managed Care, Other (non HMO) | Admitting: Orthopedic Surgery

## 2023-02-05 DIAGNOSIS — M5416 Radiculopathy, lumbar region: Secondary | ICD-10-CM

## 2023-02-05 DIAGNOSIS — M47816 Spondylosis without myelopathy or radiculopathy, lumbar region: Secondary | ICD-10-CM

## 2023-02-05 DIAGNOSIS — M4726 Other spondylosis with radiculopathy, lumbar region: Secondary | ICD-10-CM | POA: Diagnosis not present

## 2023-02-25 ENCOUNTER — Ambulatory Visit
Admission: RE | Admit: 2023-02-25 | Discharge: 2023-02-25 | Disposition: A | Discharge status: Home / Self Care | Payer: Managed Care, Other (non HMO) | Source: Ambulatory Visit | Attending: Orthopedic Surgery | Admitting: Orthopedic Surgery

## 2023-02-25 DIAGNOSIS — M5416 Radiculopathy, lumbar region: Secondary | ICD-10-CM | POA: Diagnosis present

## 2023-02-25 DIAGNOSIS — M47816 Spondylosis without myelopathy or radiculopathy, lumbar region: Secondary | ICD-10-CM | POA: Diagnosis present

## 2023-03-10 ENCOUNTER — Ambulatory Visit: Admission: RE | Admit: 2023-03-10 | Payer: Managed Care, Other (non HMO) | Source: Ambulatory Visit

## 2023-03-12 NOTE — Progress Notes (Unsigned)
Referring Physician:  No referring provider defined for this encounter.  Primary Physician:  Jesse Reichmann, MD  History of Present Illness: 03/16/2023 [name] has a history of HTN and DM.   History of ACDF C4-C7 by Jesse Sweeney in [place].   I did a phone visit with him on 02/05/23 and he continued with right posterior leg pain to his knee. History of left transverse process fractures at L3, L4, and L5 s/p fall from 10 feet years ago. He has known lumbar spondylosis.   He is here to review his lumbar MRI scan.   He has seen improvement in his right posterior leg pain, he can now move the knee with no pain. He notes weakness in both legs that is worse when he first starts walking that is new. He has constant numbness and tingling in right leg. No symptoms below his knee.   He smokes 1 ppd.   Bowel/Bladder Dysfunction: none  Conservative measures:  Physical therapy: no  Multimodal medical therapy including regular antiinflammatories: motrin  Injections: no epidural steroid injections  Past Surgery: History of ACDF C4-C7 by Jesse Sweeney in [facility] has no symptoms of cervical myelopathy. He has some tingling in hands since ACDF. No balance or dexterity issues.  The symptoms are causing a significant impact on the patient's life.   Review of Systems:  A 10 point review of systems is negative, except for the pertinent positives and negatives detailed in the HPI.  Past Medical History: Past Medical History:  Diagnosis Date   ED (erectile dysfunction)    Elevated liver function tests    Hepatitis C antibody test negative    History of fractured pelvis    Hypertension    Positive PPD    Rupture of quadriceps muscle 06/15/2012   Left, Dr. Ernest Sweeney   Tobacco use disorder     Past Surgical History: Past Surgical History:  Procedure Laterality Date   APPENDECTOMY     NECK SURGERY     fusion by Dr. Blanche Sweeney   Repair of left quadriceps tendon rupture  and repair of medial/lateral retinaculum  11/13/2011   Repair of left quadricepstendon re-rupture  06/15/2012    Allergies: Allergies as of 03/16/2023   (No Known Allergies)    Medications: Outpatient Encounter Medications as of 03/16/2023  Medication Sig   amLODipine (NORVASC) 2.5 MG tablet Take 2.5 mg by mouth daily.   Cholecalciferol (VITAMIN D-1000 MAX ST) 25 MCG (1000 UT) tablet Take 1,000 Units by mouth daily.   fenofibrate (TRICOR) 145 MG tablet Take 145 mg by mouth daily.   lisinopril-hydrochlorothiazide (ZESTORETIC) 20-12.5 MG tablet Take 2 tablets by mouth daily.   metFORMIN (GLUCOPHAGE) 500 MG tablet Take 500 mg by mouth 2 (two) times daily with a meal.   sildenafil (VIAGRA) 100 MG tablet Take 100 mg by mouth as needed.   No facility-administered encounter medications on file as of 03/16/2023.    Social History: Social History   Tobacco Use   Smoking status: Every Day    Packs/day: 1    Types: Cigarettes   Smokeless tobacco: Never    Family Medical History: History reviewed. No pertinent family history.  Physical Examination: Vitals:   03/16/23 1519 03/16/23 1557  BP: (!) 150/80 (!) 160/80      Awake, alert, oriented to person, place, and time.  Speech is clear and fluent. Fund of knowledge is appropriate.   Cranial Nerves: Pupils equal round and reactive to light.  Facial tone is symmetric.    No posterior lumbar tenderness.   No abnormal lesions on exposed skin.   Strength: Side Biceps Triceps Deltoid Interossei Grip Wrist Ext. Wrist Flex.  R 5 5 5 5 5 5 5   L 5 5 5 5 5 5 5    Side Iliopsoas Quads Hamstring PF DF EHL  R 5 5 5 5 5 5   L 5 5 5 5 5 5       Bilateral upper and lower extremity sensation is intact to light touch.     Gait is normal.     Medical Decision Making  Imaging: MRI of lumbar spine dated 02/25/23:  FINDINGS: Segmentation: There are five lumbar type vertebral bodies. The last full intervertebral disc space is labeled L5-S1.  Small vestigial ribs noted at T12 on the plain films.   Alignment:  Normal   Vertebrae: Scattered hemangiomas but no worrisome bone lesions or fractures.   Conus medullaris and cauda equina: Conus extends to the L1 level. Conus and cauda equina appear normal.   Paraspinal and other soft tissues: No significant paraspinal retroperitoneal findings.   Disc levels:   T12-L1: No significant findings.   L1-2: No significant findings.   L2-3: Mild annular, short pedicles mild facet disease and ligamentum flavum thickening contributing to mild bilateral lateral recess stenosis. No significant spinal foraminal stenosis.   L3-4 bulging annulus, short pedicles facet disease ligamentum flavum thickening contributing to mild spinal stenosis moderate bilateral lateral recess stenosis. Mild extraforaminal encroachment both exiting L3 nerve roots without direct neural compression.   L4-5: Advanced facet disease and bulging annulus contributing to spinal and bilateral lateral recess stenosis. There is also focal buckling or thickening the ligamentum flavum or possibly a complex synovial cyst on the left side contributing to the left lateral recess stenosis. No foraminal stenosis.   L5-S1: Moderate to advanced facet disease but no disc protrusions spinal or stenosis.   IMPRESSION: 1. Mild bilateral lateral recess stenosis at L2-3. 2. Mild spinal stenosis and moderate bilateral lateral recess stenosis at L3-4. Mild extraforaminal encroachment on both exiting L3 nerve roots without direct neural compression. 3. Spinal and bilateral lateral recess stenosis at L4-5. There is also focal buckling or thickening the ligamentum flavum or possibly a complex synovial cyst on the left side contributing to the left lateral recess stenosis. No foraminal stenosis. 4. Multiple hemangiomas.     Electronically Signed   By: Jesse Sweeney M.D.   On: 03/03/2023 07:19  I have personally reviewed the  images and agree with the above interpretation.    Assessment and Plan: Jesse Sweeney is a pleasant 62 y.o. male has He seen improvement in his right posterior leg pain, he can now move the knee with no pain. He notes weakness in both legs that is worse when he first starts walking that is new. He has constant numbness and tingling in right leg. No symptoms below his knee.  He has lateral recess stenosis at L2-L5, mild central stenosis at L3-L4 and L4-L5, with possible synovial cyst on left at L4-L5.   Feels of weakness with walking may be due to spinal stenosis.   Treatment options discussed with patient and following plan made:   - Discussed PT for lumbar spine again. He wants to hold off for now. I gave him lumbar HEP to do previously.  - Referral to PMR at [facility] in [place] to discuss possible injections.  - Phone visit in 6-8 weeks to check progress.   BP  was elevated. No symptoms of chest pain, shortness of breath, blurry vision, or headaches. He will recheck at home and call PCP if not improved. If he develops CP, SOB, blurry vision, or headaches, then he will go to ED.     I spent a total of 20 minutes in face-to-face and non-face-to-face activities related to this patient's care today including review of outside records, review of imaging, review of symptoms, physical exam, discussion of differential diagnosis, discussion of treatment options, and documentation.   Drake Leach PA-C Dept. of Neurosurgery

## 2023-03-16 ENCOUNTER — Encounter: Payer: Self-pay | Admitting: Orthopedic Surgery

## 2023-03-16 ENCOUNTER — Ambulatory Visit: Payer: Managed Care, Other (non HMO) | Admitting: Orthopedic Surgery

## 2023-03-16 VITALS — BP 160/80 | Ht 73.0 in | Wt 237.0 lb

## 2023-03-16 DIAGNOSIS — M4726 Other spondylosis with radiculopathy, lumbar region: Secondary | ICD-10-CM | POA: Diagnosis not present

## 2023-03-16 DIAGNOSIS — M47816 Spondylosis without myelopathy or radiculopathy, lumbar region: Secondary | ICD-10-CM

## 2023-03-16 DIAGNOSIS — M48061 Spinal stenosis, lumbar region without neurogenic claudication: Secondary | ICD-10-CM

## 2023-03-16 DIAGNOSIS — M5416 Radiculopathy, lumbar region: Secondary | ICD-10-CM

## 2023-03-16 NOTE — Patient Instructions (Signed)
It was so nice to see you today. Thank you so much for coming in.    You have some wear and tear in your back (arthritis) that is worse on the left side. Some of the feelings of weakness in your legs may be from spinal stenosis.   I want you to see physical medicine and rehab at the [facility] to discuss possible injections in your back. Dr. Yves Dill, Dr. Mariah Milling, and their PA Alphonzo Lemmings are great and will take good care of you. They should call you to schedule an appointment or you can call them at [phone]. Let them know you want to be seen in [place].   Let me know if you want to start any physical therapy for your back.   We have a phone visit scheduled in 6-8 weeks. I will call you.   Please do not hesitate to call if you have any questions or concerns. You can also message me in MyChart.   Your blood pressure was elevated today. I want you to recheck it at home and follow up with your PCP if it remains high. If you have any chest pain, shortness of breath, blurry vision, or headaches then you need to go to ED.     Drake Leach PA-C [phone]

## 2023-04-27 NOTE — Progress Notes (Deleted)
Telephone Visit- Progress Note: Referring Physician:  Barbette Reichmann, MD [address] [facility] [place],  [place] [address]  Primary Physician:  Barbette Reichmann, MD  This visit was performed via telephone.  Patient location: home Provider location: office  I spent a total of *** minutes non-face-to-face activities for this visit on the date of this encounter including review of current clinical condition and response to treatment.    Patient has given verbal consent to this telephone visits and we reviewed the limitations of a telephone visit. Patient wishes to proceed.    Chief Complaint:  ***  History of Present Illness: Jesse Sweeney is a 62 y.o. male has a history of HTN and DM.    History of ACDF C4-C7 by Dr. Phoebe Perch in [place].   Last seen by me on 03/16/23 for back and leg pain. He has lateral recess stenosis at L2-L5, mild central stenosis at L3-L4 and L4-L5, with possible synovial cyst on left at L4-L5. Feelings of weakness with walking may be due to spinal stenosis.   He was given lumbar HEP at his last visit. He was sent to PMR at [facility] to consider injections. He had right L4-L5 and right L5-S1 TF ESI by Dr. Mariah Milling on 04/09/23.   Phone visit scheduled for follow up.     Blood pressure better??? 160/80 at last visit***  He has seen improvement in his right posterior leg pain, he can now move the knee with no pain. He notes weakness in both legs that is worse when he first starts walking that is new. He has constant numbness and tingling in right leg. No symptoms below his knee.    He smokes 1 ppd.    Bowel/Bladder Dysfunction: none   Conservative measures:  Physical therapy: no  Multimodal medical therapy including regular antiinflammatories: motrin  Injections:  right L4-L5 and right L5-S1 TF ESI by Dr. Mariah Milling on 04/09/23   Past Surgery:  History of ACDF C4-C7 by Dr. Phoebe Perch in [facility] has no symptoms of cervical  myelopathy. He has some tingling in hands since ACDF. No balance or dexterity issues.    Exam: No exam done as this was a telephone encounter.     Imaging: Nothing recent to review.    Assessment and Plan: [name] is a pleasant 62 y.o. male has seen improvement in his right posterior leg pain, he can now move the knee with no pain. He notes weakness in both legs that is worse when he first starts walking that is new. He has constant numbness and tingling in right leg. No symptoms below his knee.   He has lateral recess stenosis at L2-L5, mild central stenosis at L3-L4 and L4-L5, with possible synovial cyst on left at L4-L5.    Feels of weakness with walking may be due to spinal stenosis.    Treatment options discussed with patient and following plan made:    - Discussed PT for lumbar spine again. He wants to hold off for now. I gave him lumbar HEP to do previously.  - Referral to PMR at [facility] in [place] to discuss possible injections.  - Phone visit in 6-8 weeks to check progress.    - Order for physical therapy for *** spine ***. Patient to call to schedule appointment. *** - Continue current medications including ***. Reviewed dosing and side effects.  - Prescription for ***. Reviewed dosing and side effects. Take with food.  - Prescription for ***  to take prn muscle spasms. Reviewed dosing and side effects. Discussed this can cause drowsiness.  - MRI of *** to further evaluate *** radiculopathy. No improvement time or medications (***).  - Referral to PMR at [facility] to discuss possible *** injections.  - Will schedule phone visit to review MRI results once I get them back.   Drake Leach PA-C Neurosurgery

## 2023-05-03 ENCOUNTER — Telehealth: Payer: Managed Care, Other (non HMO) | Admitting: Orthopedic Surgery
